# Patient Record
Sex: Male | Born: 1995 | Race: Black or African American | Hispanic: No | Marital: Married | Smoking: Current every day smoker
Health system: Southern US, Community
[De-identification: ages and names within clinical notes are randomized; demographics above are authoritative.]

## PROBLEM LIST (undated history)

## (undated) DIAGNOSIS — M1990 Unspecified osteoarthritis, unspecified site: Secondary | ICD-10-CM

---
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
## 2015-04-11 ENCOUNTER — Emergency Department (HOSPITAL_COMMUNITY): Payer: No Typology Code available for payment source

## 2015-04-11 ENCOUNTER — Emergency Department (HOSPITAL_COMMUNITY)
Admission: EM | Admit: 2015-04-11 | Discharge: 2015-04-11 | Disposition: A | Discharge status: Home / Self Care | Payer: No Typology Code available for payment source | Attending: Emergency Medicine | Admitting: Emergency Medicine

## 2015-04-11 ENCOUNTER — Encounter (HOSPITAL_COMMUNITY): Payer: Self-pay | Admitting: Emergency Medicine

## 2015-04-11 DIAGNOSIS — Z8739 Personal history of other diseases of the musculoskeletal system and connective tissue: Secondary | ICD-10-CM | POA: Diagnosis not present

## 2015-04-11 DIAGNOSIS — S3992XA Unspecified injury of lower back, initial encounter: Secondary | ICD-10-CM | POA: Insufficient documentation

## 2015-04-11 DIAGNOSIS — S4992XA Unspecified injury of left shoulder and upper arm, initial encounter: Secondary | ICD-10-CM | POA: Diagnosis not present

## 2015-04-11 DIAGNOSIS — Y9389 Activity, other specified: Secondary | ICD-10-CM | POA: Diagnosis not present

## 2015-04-11 DIAGNOSIS — Z720 Tobacco use: Secondary | ICD-10-CM | POA: Diagnosis not present

## 2015-04-11 DIAGNOSIS — S8992XA Unspecified injury of left lower leg, initial encounter: Secondary | ICD-10-CM | POA: Insufficient documentation

## 2015-04-11 DIAGNOSIS — Y92410 Unspecified street and highway as the place of occurrence of the external cause: Secondary | ICD-10-CM | POA: Diagnosis not present

## 2015-04-11 DIAGNOSIS — Y998 Other external cause status: Secondary | ICD-10-CM | POA: Diagnosis not present

## 2015-04-11 HISTORY — DX: Unspecified osteoarthritis, unspecified site: M19.90

## 2015-04-11 MED ORDER — METHOCARBAMOL 500 MG PO TABS: 500.0000 mg | ORAL_TABLET | Freq: Two times a day (BID) | ORAL | Status: AC

## 2015-04-11 MED ORDER — IBUPROFEN 400 MG PO TABS
800.0000 mg | ORAL_TABLET | Freq: Once | ORAL | Status: AC
  Administered 2015-04-11: 800 mg via ORAL
  Filled 2015-04-11: qty 2

## 2015-04-11 MED ORDER — IBUPROFEN 800 MG PO TABS: 800.0000 mg | ORAL_TABLET | Freq: Three times a day (TID) | ORAL | Status: AC

## 2015-04-11 NOTE — Discharge Instructions (Signed)
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
## 2015-11-06 ENCOUNTER — Encounter: Payer: Self-pay | Admitting: *Deleted
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
## 2018-08-22 ENCOUNTER — Emergency Department (HOSPITAL_COMMUNITY)
Admission: EM | Admit: 2018-08-22 | Discharge: 2018-08-22 | Disposition: A | Discharge status: Home / Self Care | Payer: BLUE CROSS/BLUE SHIELD | Attending: Emergency Medicine | Admitting: Emergency Medicine

## 2018-08-22 ENCOUNTER — Other Ambulatory Visit: Payer: Self-pay

## 2018-08-22 ENCOUNTER — Encounter (HOSPITAL_COMMUNITY): Payer: Self-pay | Admitting: Emergency Medicine

## 2018-08-22 DIAGNOSIS — F17210 Nicotine dependence, cigarettes, uncomplicated: Secondary | ICD-10-CM | POA: Diagnosis not present

## 2018-08-22 DIAGNOSIS — Z202 Contact with and (suspected) exposure to infections with a predominantly sexual mode of transmission: Secondary | ICD-10-CM | POA: Diagnosis not present

## 2018-08-22 MED ORDER — AZITHROMYCIN 250 MG PO TABS
1000.0000 mg | ORAL_TABLET | Freq: Once | ORAL | Status: AC
  Administered 2018-08-22: 1000 mg via ORAL
  Filled 2018-08-22: qty 4

## 2018-08-22 NOTE — ED Notes (Signed)
Patient verbalizes understanding of discharge instructions. Opportunity for questioning and answers were provided. Armband removed by staff, pt discharged from ED ambulatory.   

## 2018-08-22 NOTE — ED Provider Notes (Signed)
[facility] EMERGENCY DEPARTMENT Provider Note   CSN: [id number] Arrival date & time: 08/22/18  1612     History   Chief Complaint Chief Complaint  Patient presents with  . Exposure to STD    HPI Donald Terrell is a 23 y.o. male.  The history is provided by the patient. No language interpreter was used.  Exposure to STD    Donald Terrell is a 23 y.o. male who presents to the Emergency Department complaining of pleasure to chlamydia. He presents to the emergency department because his girlfriend was just notified that she had chlamydia. He denies any complaints. He denies any abdominal pain, dysuria, penile discharge, swelling, nausea, vomiting. He denies any past medical history and takes no medications. Past Medical History:  Diagnosis Date  . Arthritis     There are no active problems to display for this patient.   History reviewed. No pertinent surgical history.      Home Medications    Prior to Admission medications   Medication Sig Start Date End Date Taking? Authorizing Provider  ibuprofen (ADVIL,MOTRIN) 800 MG tablet Take 1 tablet (800 mg total) by mouth 3 (three) times daily. 04/11/15   Fayrene Helper, PA-C  methocarbamol (ROBAXIN) 500 MG tablet Take 1 tablet (500 mg total) by mouth 2 (two) times daily. 04/11/15   Fayrene Helper, PA-C    Family History No family history on file.  Social History Social History   Tobacco Use  . Smoking status: Current Every Day Smoker    Types: Cigarettes  Substance Use Topics  . Alcohol use: No  . Drug use: Yes    Types: Marijuana     Allergies   Patient has no known allergies.   Review of Systems Review of Systems  All other systems reviewed and are negative.    Physical Exam Updated Vital Signs BP (!) 145/87 (BP Location: Right Arm)   Pulse 86   Temp 98.2 F (36.8 C) (Oral)   Resp 16   SpO2 99%   Physical Exam Vitals signs and nursing note reviewed.  Constitutional:      Appearance:  Normal appearance.  HENT:     Head: Normocephalic and atraumatic.  Neck:     Musculoskeletal: Normal range of motion.  Cardiovascular:     Rate and Rhythm: Normal rate.     Heart sounds: No murmur.  Musculoskeletal:        General: No swelling.  Skin:    General: Skin is warm and dry.     Capillary Refill: Capillary refill takes less than 2 seconds.  Neurological:     Mental Status: He is alert and oriented to person, place, and time.  Psychiatric:        Mood and Affect: Mood normal.        Behavior: Behavior normal.      ED Treatments / Results  Labs (all labs ordered are listed, but only abnormal results are displayed) Labs Reviewed  GC/CHLAMYDIA PROBE AMP ([place]) NOT AT [facility]    EKG None  Radiology No results found.  Procedures Procedures (including critical care time)  Medications Ordered in ED Medications  azithromycin (ZITHROMAX) tablet 1,000 mg (has no administration in time range)     Initial Impression / Assessment and Plan / ED Course  I have reviewed the triage vital signs and the nursing notes.  Pertinent labs & imaging results that were available during my care of the patient were reviewed by me  and considered in my medical decision making (see chart for details).     Patient here following exposure to chlamydia. He is asymptomatic in the emergency department. Discussed with patient recommendation for HIV, syphilis testing and patient declines. Discussed empiric treatment for possible gonorrhea with Rocephin IM and patient declines. He is agreeable to oral antibiotics. Will treat empirically for possible chlamydia with one-time dose of azithromycin. Discussed abstinence for the next two weeks, outpatient follow-up and return precautions.  Final Clinical Impressions(s) / ED Diagnoses   Final diagnoses:  STD exposure    ED Discharge Orders    None       Tilden Fossa, MD 08/22/18 1750

## 2018-08-22 NOTE — ED Triage Notes (Signed)
Pt stated his girlfriend found out today she has chlamydia. Pt wants to be treated for STD. Pt states he has no symptoms,. Girlfriend at bedside

## 2018-08-24 LAB — GC/CHLAMYDIA PROBE AMP (~~LOC~~) NOT AT ARMC
Chlamydia: NEGATIVE
NEISSERIA GONORRHEA: NEGATIVE

## 2023-03-23 ENCOUNTER — Emergency Department (HOSPITAL_COMMUNITY): Payer: Commercial Managed Care - HMO

## 2023-03-23 ENCOUNTER — Encounter (HOSPITAL_COMMUNITY): Payer: Self-pay

## 2023-03-23 ENCOUNTER — Emergency Department (HOSPITAL_COMMUNITY)
Admission: EM | Admit: 2023-03-23 | Discharge: 2023-03-23 | Disposition: A | Discharge status: Home / Self Care | Payer: Commercial Managed Care - HMO | Attending: Emergency Medicine | Admitting: Emergency Medicine

## 2023-03-23 ENCOUNTER — Other Ambulatory Visit: Payer: Self-pay

## 2023-03-23 DIAGNOSIS — M25561 Pain in right knee: Secondary | ICD-10-CM | POA: Insufficient documentation

## 2023-03-23 DIAGNOSIS — R0981 Nasal congestion: Secondary | ICD-10-CM | POA: Insufficient documentation

## 2023-03-23 DIAGNOSIS — Z20822 Contact with and (suspected) exposure to covid-19: Secondary | ICD-10-CM | POA: Diagnosis not present

## 2023-03-23 DIAGNOSIS — G8929 Other chronic pain: Secondary | ICD-10-CM | POA: Diagnosis not present

## 2023-03-23 DIAGNOSIS — M25562 Pain in left knee: Secondary | ICD-10-CM | POA: Insufficient documentation

## 2023-03-23 LAB — RESP PANEL BY RT-PCR (RSV, FLU A&B, COVID)  RVPGX2
Influenza A by PCR: NEGATIVE
Influenza B by PCR: NEGATIVE
Resp Syncytial Virus by PCR: NEGATIVE
SARS Coronavirus 2 by RT PCR: NEGATIVE

## 2023-03-23 NOTE — Discharge Instructions (Signed)
You are seen today for knee pain and congestion.  Your x-rays were unremarkable with no acute findings noted.  Your respiratory viral panel was also negative for COVID, influenza, RSV.  Advised taking an anti-inflammatory such as ibuprofen or naproxen at home over the next 1 to 2 weeks to see if this improves her symptoms.  Follow-up with your primary care provider for repeat evaluation.  If symptoms are worsening, return to the emergency department.
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
## 2023-03-23 NOTE — ED Triage Notes (Signed)
Pt coming for bilateral knee pain that refers up to his hips. Pt states that it has been going on for a couple of years. States in increases and decreases over time, concerned that it has been going on for so long.
